# Patient Record
Sex: Male | Born: 2000 | Race: White | Hispanic: No | Marital: Single | Smoking: Never smoker
Health system: Southern US, Community
[De-identification: ages and names within clinical notes are randomized; demographics above are authoritative.]

## PROBLEM LIST (undated history)

## (undated) ENCOUNTER — Encounter: Attending: Pediatrics | Primary: Pediatrics

## (undated) ENCOUNTER — Encounter

## (undated) ENCOUNTER — Ambulatory Visit

## (undated) ENCOUNTER — Telehealth

## (undated) ENCOUNTER — Telehealth: Attending: Pediatrics | Primary: Pediatrics

## (undated) ENCOUNTER — Encounter: Attending: Pediatric Infectious Diseases | Primary: Pediatric Infectious Diseases

## (undated) ENCOUNTER — Ambulatory Visit: Payer: PRIVATE HEALTH INSURANCE | Attending: Pediatrics | Primary: Pediatrics

## (undated) ENCOUNTER — Telehealth: Attending: Registered" | Primary: Registered"

## (undated) ENCOUNTER — Telehealth: Attending: Pediatric Infectious Diseases | Primary: Pediatric Infectious Diseases

## (undated) DIAGNOSIS — K5190 Ulcerative colitis, unspecified, without complications: Secondary | ICD-10-CM

---
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
## 2020-02-21 ENCOUNTER — Telehealth
Admit: 2020-02-21 | Discharge: 2020-02-22 | Payer: PRIVATE HEALTH INSURANCE | Attending: Pediatrics | Primary: Pediatrics

## 2020-02-21 DIAGNOSIS — K529 Noninfective gastroenteritis and colitis, unspecified: Principal | ICD-10-CM

## 2020-07-01 ENCOUNTER — Telehealth
Admit: 2020-07-01 | Discharge: 2020-07-02 | Payer: PRIVATE HEALTH INSURANCE | Attending: Pediatrics | Primary: Pediatrics

## 2020-07-01 DIAGNOSIS — K529 Noninfective gastroenteritis and colitis, unspecified: Principal | ICD-10-CM

## 2020-11-27 MED ORDER — ERGOCALCIFEROL (VITAMIN D2) 1,250 MCG (50,000 UNIT) CAPSULE
ORAL_CAPSULE | ORAL | 1 refills | 42 days | Status: CP
Start: 2020-11-27 — End: 2021-02-19

## 2020-12-02 ENCOUNTER — Telehealth
Admit: 2020-12-02 | Discharge: 2020-12-03 | Payer: PRIVATE HEALTH INSURANCE | Attending: Pediatrics | Primary: Pediatrics

## 2020-12-02 DIAGNOSIS — K529 Noninfective gastroenteritis and colitis, unspecified: Principal | ICD-10-CM

## 2021-01-08 DIAGNOSIS — K529 Noninfective gastroenteritis and colitis, unspecified: Principal | ICD-10-CM

## 2021-03-13 DIAGNOSIS — K50814 Crohn's disease of both small and large intestine with abscess: Principal | ICD-10-CM

## 2021-03-13 MED ORDER — HUMIRA PEN CITRATE FREE 40 MG/0.4 ML
11 refills | 0 days
Start: 2021-03-13 — End: ?

## 2021-03-17 ENCOUNTER — Telehealth
Admit: 2021-03-17 | Discharge: 2021-03-18 | Payer: PRIVATE HEALTH INSURANCE | Attending: Pediatrics | Primary: Pediatrics

## 2021-03-17 DIAGNOSIS — K50819 Crohn's disease of both small and large intestine with unspecified complications: Principal | ICD-10-CM

## 2021-03-18 MED ORDER — HUMIRA PEN CITRATE FREE 40 MG/0.4 ML
SUBCUTANEOUS | 11 refills | 20 days | Status: CP
Start: 2021-03-18 — End: ?

## 2021-03-19 DIAGNOSIS — K50819 Crohn's disease of both small and large intestine with unspecified complications: Principal | ICD-10-CM

## 2021-04-14 DIAGNOSIS — K50819 Crohn's disease of both small and large intestine with unspecified complications: Principal | ICD-10-CM

## 2021-10-31 DIAGNOSIS — K50814 Crohn's disease of both small and large intestine with abscess: Principal | ICD-10-CM

## 2021-10-31 MED ORDER — HUMIRA PEN CITRATE FREE 40 MG/0.4 ML
11 refills | 0 days
Start: 2021-10-31 — End: ?

## 2021-12-12 DIAGNOSIS — K50814 Crohn's disease of both small and large intestine with abscess: Principal | ICD-10-CM

## 2021-12-12 MED ORDER — HUMIRA PEN CITRATE FREE 40 MG/0.4 ML
SUBCUTANEOUS | 4 refills | 20 days | Status: CP
Start: 2021-12-12 — End: 2022-03-12

## 2021-12-26 DIAGNOSIS — K50814 Crohn's disease of both small and large intestine with abscess: Principal | ICD-10-CM

## 2021-12-26 MED ORDER — HUMIRA PEN CITRATE FREE 40 MG/0.4 ML
SUBCUTANEOUS | 4 refills | 20.00000 days
Start: 2021-12-26 — End: 2022-03-26

## 2021-12-29 MED ORDER — HUMIRA PEN CITRATE FREE 40 MG/0.4 ML
SUBCUTANEOUS | 0 refills | 60 days | Status: CP
Start: 2021-12-29 — End: 2022-02-27

## 2022-01-19 ENCOUNTER — Ambulatory Visit
Admit: 2022-01-19 | Discharge: 2022-01-20 | Payer: PRIVATE HEALTH INSURANCE | Attending: Pediatrics | Primary: Pediatrics

## 2022-01-19 DIAGNOSIS — K50819 Crohn's disease of both small and large intestine with unspecified complications: Principal | ICD-10-CM

## 2022-01-22 MED ORDER — HUMIRA PEN CITRATE FREE 40 MG/0.4 ML
11 refills | 0 days
Start: 2022-01-22 — End: ?

## 2022-02-10 ENCOUNTER — Ambulatory Visit: Admit: 2022-02-10 | Discharge: 2022-02-11 | Payer: PRIVATE HEALTH INSURANCE

## 2022-02-10 DIAGNOSIS — Z5181 Encounter for therapeutic drug level monitoring: Principal | ICD-10-CM

## 2022-02-10 DIAGNOSIS — K50113 Crohn's disease of large intestine with fistula: Principal | ICD-10-CM

## 2022-02-27 DIAGNOSIS — K50814 Crohn's disease of both small and large intestine with abscess: Principal | ICD-10-CM

## 2022-02-27 MED ORDER — HUMIRA PEN CITRATE FREE 40 MG/0.4 ML
SUBCUTANEOUS | 0 refills | 60 days | Status: CP
Start: 2022-02-27 — End: 2022-04-19

## 2022-03-12 DIAGNOSIS — K50814 Crohn's disease of both small and large intestine with abscess: Principal | ICD-10-CM

## 2022-03-12 MED ORDER — HUMIRA PEN CITRATE FREE 40 MG/0.4 ML
SUBCUTANEOUS | 0 refills | 60 days
Start: 2022-03-12 — End: 2022-05-02

## 2022-03-13 MED ORDER — HUMIRA PEN CITRATE FREE 40 MG/0.4 ML
SUBCUTANEOUS | 0 refills | 60 days
Start: 2022-03-13 — End: 2022-05-03

## 2022-03-20 DIAGNOSIS — K50814 Crohn's disease of both small and large intestine with abscess: Principal | ICD-10-CM

## 2022-03-20 MED ORDER — HUMIRA PEN CITRATE FREE 40 MG/0.4 ML
SUBCUTANEOUS | 1 refills | 90 days | Status: CP
Start: 2022-03-20 — End: ?

## 2022-04-05 DIAGNOSIS — K50814 Crohn's disease of both small and large intestine with abscess: Principal | ICD-10-CM

## 2022-04-05 MED ORDER — HUMIRA PEN CITRATE FREE 40 MG/0.4 ML
SUBCUTANEOUS | 1 refills | 90 days
Start: 2022-04-05 — End: ?

## 2022-04-06 MED ORDER — HUMIRA PEN CITRATE FREE 40 MG/0.4 ML
SUBCUTANEOUS | 1 refills | 90 days | Status: CP
Start: 2022-04-06 — End: ?

## 2022-09-15 DIAGNOSIS — K50814 Crohn's disease of both small and large intestine with abscess: Principal | ICD-10-CM

## 2022-09-15 MED ORDER — HUMIRA PEN CITRATE FREE 40 MG/0.4 ML
0 refills | 0 days | Status: CP
Start: 2022-09-15 — End: ?

## 2022-09-22 ENCOUNTER — Ambulatory Visit: Admit: 2022-09-22 | Discharge: 2022-09-23 | Payer: PRIVATE HEALTH INSURANCE

## 2022-09-22 DIAGNOSIS — K50814 Crohn's disease of both small and large intestine with abscess: Principal | ICD-10-CM

## 2022-09-30 DIAGNOSIS — K50814 Crohn's disease of both small and large intestine with abscess: Principal | ICD-10-CM

## 2022-09-30 MED ORDER — HUMIRA PEN CITRATE FREE 40 MG/0.4 ML
0 refills | 0 days
Start: 2022-09-30 — End: ?

## 2022-10-01 MED ORDER — HUMIRA PEN CITRATE FREE 40 MG/0.4 ML
11 refills | 0 days | Status: CP
Start: 2022-10-01 — End: ?

## 2023-02-25 DIAGNOSIS — K50814 Crohn's disease of both small and large intestine with abscess: Principal | ICD-10-CM

## 2023-02-25 MED ORDER — HADLIMA(CF) PUSHTOUCH 40 MG/0.4 ML SUBCUTANEOUS AUTO-INJECTOR
SUBCUTANEOUS | 3 refills | 0 days | Status: CP
Start: 2023-02-25 — End: ?

## 2023-04-30 ENCOUNTER — Encounter: Admit: 2023-04-30 | Discharge: 2023-04-30 | Payer: PRIVATE HEALTH INSURANCE

## 2023-04-30 ENCOUNTER — Ambulatory Visit: Admit: 2023-04-30 | Discharge: 2023-04-30 | Payer: PRIVATE HEALTH INSURANCE

## 2023-05-04 DIAGNOSIS — K50814 Crohn's disease of both small and large intestine with abscess: Principal | ICD-10-CM

## 2023-05-04 MED ORDER — HADLIMA(CF) PUSHTOUCH 40 MG/0.4 ML SUBCUTANEOUS AUTO-INJECTOR
0 refills | 0 days | Status: CP
Start: 2023-05-04 — End: ?

## 2023-05-19 DIAGNOSIS — K50814 Crohn's disease of both small and large intestine with abscess: Principal | ICD-10-CM

## 2023-05-19 MED ORDER — HADLIMA(CF) PUSHTOUCH 40 MG/0.4 ML SUBCUTANEOUS AUTO-INJECTOR
0 refills | 0 days
Start: 2023-05-19 — End: ?

## 2023-05-24 MED ORDER — HADLIMA(CF) PUSHTOUCH 40 MG/0.4 ML SUBCUTANEOUS AUTO-INJECTOR
0 refills | 0 days
Start: 2023-05-24 — End: ?

## 2023-07-02 DIAGNOSIS — K50814 Crohn's disease of both small and large intestine with abscess: Principal | ICD-10-CM

## 2023-07-02 MED ORDER — HADLIMA(CF) PUSHTOUCH 40 MG/0.4 ML SUBCUTANEOUS AUTO-INJECTOR
SUBCUTANEOUS | 2 refills | 0 days | Status: CP
Start: 2023-07-02 — End: ?

## 2023-08-10 ENCOUNTER — Ambulatory Visit: Admit: 2023-08-10 | Discharge: 2023-08-11 | Payer: PRIVATE HEALTH INSURANCE

## 2023-08-10 DIAGNOSIS — D84821 Immunosuppression due to drug therapy (CMS-HCC): Principal | ICD-10-CM

## 2023-08-10 DIAGNOSIS — K50814 Crohn's disease of both small and large intestine with abscess: Principal | ICD-10-CM

## 2023-08-10 DIAGNOSIS — Z79899 Other long term (current) drug therapy: Principal | ICD-10-CM

## 2023-08-19 DIAGNOSIS — K50814 Crohn's disease of both small and large intestine with abscess: Principal | ICD-10-CM

## 2023-08-19 MED ORDER — HADLIMA(CF) PUSHTOUCH 40 MG/0.4 ML SUBCUTANEOUS AUTO-INJECTOR
5 refills | 0 days | Status: CP
Start: 2023-08-19 — End: ?

## 2023-11-20 DIAGNOSIS — K50814 Crohn's disease of both small and large intestine with abscess: Principal | ICD-10-CM

## 2023-11-20 MED ORDER — HADLIMA(CF) PUSHTOUCH 40 MG/0.4 ML SUBCUTANEOUS AUTO-INJECTOR
5 refills | 0.00 days
Start: 2023-11-20 — End: ?

## 2023-11-23 MED ORDER — HADLIMA(CF) PUSHTOUCH 40 MG/0.4 ML SUBCUTANEOUS AUTO-INJECTOR
3 refills | 0.00 days | Status: CP
Start: 2023-11-23 — End: ?

## 2024-02-08 DIAGNOSIS — K50814 Crohn's disease of both small and large intestine with abscess: Principal | ICD-10-CM

## 2024-02-08 MED ORDER — HADLIMA(CF) PUSHTOUCH 40 MG/0.4 ML SUBCUTANEOUS AUTO-INJECTOR
0 refills | 0.00 days | Status: CP
Start: 2024-02-08 — End: ?

## 2024-03-07 DIAGNOSIS — K50814 Crohn's disease of both small and large intestine with abscess: Principal | ICD-10-CM

## 2024-03-07 MED ORDER — HADLIMA(CF) PUSHTOUCH 40 MG/0.4 ML SUBCUTANEOUS AUTO-INJECTOR
SUBCUTANEOUS | 0 refills | 0.00000 days | Status: CP
Start: 2024-03-07 — End: ?

## 2024-03-28 DIAGNOSIS — K50814 Crohn's disease of both small and large intestine with abscess: Principal | ICD-10-CM

## 2024-03-28 MED ORDER — HADLIMA(CF) PUSHTOUCH 40 MG/0.4 ML SUBCUTANEOUS AUTO-INJECTOR
0 refills | 0.00000 days
Start: 2024-03-28 — End: ?

## 2024-04-05 MED ORDER — HADLIMA(CF) PUSHTOUCH 40 MG/0.4 ML SUBCUTANEOUS AUTO-INJECTOR
SUBCUTANEOUS | 1 refills | 0.00000 days | Status: CP
Start: 2024-04-05 — End: ?

## 2024-05-08 ENCOUNTER — Ambulatory Visit: Admit: 2024-05-08 | Discharge: 2024-05-09 | Payer: PRIVATE HEALTH INSURANCE

## 2024-05-08 DIAGNOSIS — D84821 Immunosuppression due to drug therapy (HHS-HCC): Principal | ICD-10-CM

## 2024-05-08 DIAGNOSIS — Z79899 Other long term (current) drug therapy: Principal | ICD-10-CM

## 2024-05-08 DIAGNOSIS — K50814 Crohn's disease of both small and large intestine with abscess: Principal | ICD-10-CM

## 2024-05-08 MED ORDER — HADLIMA(CF) PUSHTOUCH 40 MG/0.4 ML SUBCUTANEOUS AUTO-INJECTOR
1 refills | 0.00000 days
Start: 2024-05-08 — End: ?

## 2024-05-09 MED ORDER — HADLIMA(CF) PUSHTOUCH 40 MG/0.4 ML SUBCUTANEOUS AUTO-INJECTOR
SUBCUTANEOUS | 6 refills | 0.00000 days | Status: CP
Start: 2024-05-09 — End: ?

## 2024-09-11 DIAGNOSIS — K50814 Crohn's disease of both small and large intestine with abscess: Principal | ICD-10-CM

## 2024-09-11 MED ORDER — HADLIMA(CF) PUSHTOUCH 40 MG/0.4 ML SUBCUTANEOUS AUTO-INJECTOR
SUBCUTANEOUS | 3 refills | 0.00000 days | Status: CP
Start: 2024-09-11 — End: ?
